# Patient Record
Sex: Female | Born: 1950 | Hispanic: No | Marital: Married | Smoking: Never smoker
Health system: Southern US, Community
[De-identification: ages and names within clinical notes are randomized; demographics above are authoritative.]

## PROBLEM LIST (undated history)

## (undated) ENCOUNTER — Emergency Department (HOSPITAL_COMMUNITY): Discharge status: Absent without leave | Payer: Self-pay

## (undated) DIAGNOSIS — E785 Hyperlipidemia, unspecified: Secondary | ICD-10-CM

## (undated) HISTORY — DX: Hyperlipidemia, unspecified: E78.5

---

## 2006-08-23 ENCOUNTER — Other Ambulatory Visit: Admission: RE | Admit: 2006-08-23 | Discharge: 2006-08-23 | Payer: Self-pay | Admitting: Family Medicine

## 2013-05-22 ENCOUNTER — Telehealth: Payer: Self-pay | Admitting: Family Medicine

## 2013-05-23 NOTE — Telephone Encounter (Signed)
Spoke with spouse and he stated needed appt for  cpx for her. appt given for august 12 -

## 2013-06-25 ENCOUNTER — Ambulatory Visit (INDEPENDENT_AMBULATORY_CARE_PROVIDER_SITE_OTHER): Payer: 59 | Admitting: Family Medicine

## 2013-06-25 ENCOUNTER — Encounter: Payer: Self-pay | Admitting: Family Medicine

## 2013-06-25 VITALS — BP 134/91 | HR 90 | Temp 97.1°F | Ht 63.0 in | Wt 140.0 lb

## 2013-06-25 DIAGNOSIS — M79643 Pain in unspecified hand: Secondary | ICD-10-CM | POA: Insufficient documentation

## 2013-06-25 DIAGNOSIS — E785 Hyperlipidemia, unspecified: Secondary | ICD-10-CM

## 2013-06-25 DIAGNOSIS — Z119 Encounter for screening for infectious and parasitic diseases, unspecified: Secondary | ICD-10-CM | POA: Insufficient documentation

## 2013-06-25 DIAGNOSIS — M79642 Pain in left hand: Secondary | ICD-10-CM

## 2013-06-25 DIAGNOSIS — Z124 Encounter for screening for malignant neoplasm of cervix: Secondary | ICD-10-CM

## 2013-06-25 DIAGNOSIS — Z0000 Encounter for general adult medical examination without abnormal findings: Secondary | ICD-10-CM | POA: Insufficient documentation

## 2013-06-25 DIAGNOSIS — J310 Chronic rhinitis: Secondary | ICD-10-CM | POA: Insufficient documentation

## 2013-06-25 MED ORDER — FLUTICASONE PROPIONATE 50 MCG/ACT NA SUSP: 2.0000 | Freq: Every day | NASAL | Status: DC

## 2013-06-25 NOTE — Progress Notes (Signed)
Patient ID: Judy Payne, female   DOB: May 15, 1951, 62 y.o.   MRN: [id number] SUBJECTIVE: CC: Chief Complaint  Patient presents with  . Annual Exam    cpx   HPI: Left nostril congestion and burning. Otherwise here for her annual check up  Past Medical History  Diagnosis Date  . Hyperlipidemia    No past surgical history on file. History   Social History  . Marital Status: Unknown    Spouse Name: N/A    Number of Children: N/A  . Years of Education: N/A   Occupational History  . Not on file.   Social History Main Topics  . Smoking status: Never Smoker   . Smokeless tobacco: Not on file  . Alcohol Use: 1.2 oz/week    2 Glasses of wine per week     Comment: occassionally  . Drug Use: No  . Sexually Active: Not on file   Other Topics Concern  . Not on file   Social History Narrative  . No narrative on file   Family History  Problem Relation Age of Onset  . Alzheimer's disease Mother   . Heart disease Father   . Diabetes Son   . Cancer Maternal Grandmother     ovarian  . Stroke Maternal Grandfather    No current outpatient prescriptions on file prior to visit.   No current facility-administered medications on file prior to visit.   No Known Allergies  There is no immunization history on file for this patient. Prior to Admission medications   Medication Sig Start Date End Date Taking? Authorizing Provider  fluticasone (FLONASE) 50 MCG/ACT nasal spray Place 2 sprays into the nose daily. 06/25/13   Ileana Ladd, MD     ROS: As above in the HPI. All other systems are stable or negative.  OBJECTIVE: APPEARANCE:  Patient in no acute distress.The patient appeared well nourished and normally developed. Acyanotic. Waist: VITAL SIGNS:BP 134/91  Pulse 90  Temp(Src) 97.1 F (36.2 C) (Oral)  Ht 5\' 3"  (1.6 m)  Wt 140 lb (63.504 kg)  BMI 24.81 kg/m2  WF looks very well.  SKIN: warm and  Dry without overt rashes, tattoos and scars  HEAD and Neck:  without JVD, Head and scalp: normal Eyes:No scleral icterus. Fundi normal, eye movements normal. Ears: Auricle normal, canal normal, Tympanic membranes normal, insufflation normal. Nose: slight left deviation of septum and nasal mucosal swelling on the left. Throat: normal Neck & thyroid: normal  CHEST & LUNGS: Chest wall: normal Lungs: Clear  Breast Exam: Appearance:  Skin : normal Areolas normal Nipples normal Dimples: Absent Palpation:Normal No Masses No Lumps  Lymph Nodes: Axillary: Normal  CVS: Reveals the PMI to be normally located. Regular rhythm, First and Second Heart sounds are normal,  absence of murmurs, rubs or gallops. Peripheral vasculature: Radial pulses: normal Dorsal pedis pulses: normal Posterior pulses: normal  ABDOMEN:  Appearance: normal Benign, no organomegaly, no masses, no Abdominal Aortic enlargement. No Guarding , no rebound. No Bruits. Bowel sounds: normal   RECTAL: benign , heme negative GU: atrophic vagina. PELVIC EXAM:  EXTERNAL Vulva:: Normal  SPECULUM: Vagina: atrophic  Cervix: Normal  BIMANUAL: CMT: Negative Adnexae: Normal  Uterus: Anteverted Normal Size  Rectovaginal: Normal Hemoccult: Negative    EXTREMETIES: nonedematous. Left palm: swelling tender base of the left forefinger, with slight prominence of the flexor tendon. Both Femoral and Pedal pulses are normal.  MUSCULOSKELETAL:  Spine: normal Joints: intact  NEUROLOGIC: oriented to time,place and person; nonfocal. Strength  is normal Sensory is normal Reflexes are normal Cranial Nerves are normal.  ASSESSMENT: Annual physical exam - Plan: EKG 12-Lead, Ambulatory referral to Hand Surgery, CBC With differential/Platelet  HLD (hyperlipidemia) - Plan: CMP14+EGFR, NMR, lipoprofile  Rhinitis - Plan: fluticasone (FLONASE) 50 MCG/ACT nasal spray  Hand pain, left - swelling;tendinopathy.? dupuytren's  Screening examination for infectious disease - Plan:  Hepatitis C antibody   PLAN: Orders Placed This Encounter  Procedures  . CBC With differential/Platelet    Standing Status: Future     Number of Occurrences:      Standing Expiration Date: 06/25/2014  . CMP14+EGFR    Standing Status: Future     Number of Occurrences:      Standing Expiration Date: 06/25/2014  . NMR, lipoprofile    Standing Status: Future     Number of Occurrences:      Standing Expiration Date: 06/25/2014  . Hepatitis C antibody    Standing Status: Future     Number of Occurrences:      Standing Expiration Date: 06/25/2014  . Ambulatory referral to Hand Surgery    Referral Priority:  Routine    Referral Type:  Surgical    Referral Reason:  Specialty Services Required    Requested Specialty:  Hand Surgery    Number of Visits Requested:  1  . EKG 12-Lead         HEALTH MAINTENANCE Immunizations: Tetanus-Diphtheria Booster due:2016 Pertusis Booster due:2016 Flu Shot Due: every Fall Pneumonia Vaccine: usually at 63 years of age unless there are certain risk situations. Herpes Zoster/Shingles Vaccine due: usually at 63 years of age HPV ZOX:WRUE age 90 to 79 years in males and females.  Healthy Life Habits: Exercise Goal: 5-6 days/week; start gradually(ie 30 minutes/3days per week) Nutrition: Balanced healthy meals including Vegetables and Fruits. Consider  Reading the following books: 1) Eat to Live by Dr Ottis Stain; 2) Prevent and Reverse Heart Disease by Dr Suzzette Righter.  Vitamins:ok a multivitamin Aspirin:n/a Stop Tobacco Use:n/a Seat Belt Use:+++ recommended Sunscreen Use:+++ recommended Osteoporosis Prevention: 1) Exercise 2) Calcium/Vitamin D requirements:he [employer] [facility] [employer] recommends:    Calcium:  800 mg/day for children 77-69 years of age          63 mg/day for children 78-63 years of age          63 mg/day for adults 65-8 years of age          63 mg/day for everyone more than 63 years of  age     Vitamin D: 800 IU per day or as prescribed if you are deficient.  Recommended Screening Tests: Colon Cancer Screening:due 2016 Blood work: tomorrow Cholesterol Screening:    Tomorrow fasting        HIV:    ?               Hepatitis C(people born 1945-1965):tomorrow  Mammogram:ASAP DEXA/Bone Density: ASAP GYN Exam: today Monthly Self Breast Exam: +++  Eye Exam: every 1 to 2 years recommended Dental Health: at least every 6 months  Others:    Living Will/Healthcare Power of Attorney: should have this in order with your personal estate planning  Return in about 1 year (around 06/25/2014) for physical.  Thelma Barge P. [place] Charon, M.D.

## 2013-06-25 NOTE — Patient Instructions (Addendum)
HEALTH MAINTENANCE Immunizations: Tetanus-Diphtheria Booster due:2016 Pertusis Booster due:2016 Flu Shot Due: every Fall Pneumonia Vaccine: usually at 63 years of age unless there are certain risk situations. Herpes Zoster/Shingles Vaccine due: usually at 63 years of age HPV NWG:NFAO age 30 to 33 years in males and females.  Healthy Life Habits: Exercise Goal: 5-6 days/week; start gradually(ie 30 minutes/3days per week) Nutrition: Balanced healthy meals including Vegetables and Fruits. Consider  Reading the following books: 1) Eat to Live by Dr Ottis Stain; 2) Prevent and Reverse Heart Disease by Dr Suzzette Righter.  Vitamins:ok a multivitamin Aspirin:n/a Stop Tobacco Use:n/a Seat Belt Use:+++ recommended Sunscreen Use:+++ recommended Osteoporosis Prevention: 1) Exercise 2) Calcium/Vitamin D requirements:he [employer] [facility] [employer] recommends:    Calcium:  800 mg/day for children 56-72 years of age          63 mg/day for children 43-75 years of age          63 mg/day for adults 23-62 years of age          63 mg/day for everyone more than 63 years of age     Vitamin D: 800 IU per day or as prescribed if you are deficient.  Recommended Screening Tests: Colon Cancer Screening:due 2016 Blood work: tomorrow Cholesterol Screening:    Tomorrow fasting        HIV:    ?               Hepatitis C(people born 1945-1965):tomorrow  Mammogram:ASAP DEXA/Bone Density: ASAP GYN Exam: today Monthly Self Breast Exam: +++  Eye Exam: every 1 to 2 years recommended Dental Health: at least every 6 months  Others:    Living Will/Healthcare Power of Attorney: should have this in order with your personal estate planning

## 2013-06-25 NOTE — Addendum Note (Signed)
Addended by: Orma Render F on: 06/25/2013 04:35 PM   Modules accepted: Orders

## 2013-06-26 ENCOUNTER — Other Ambulatory Visit (INDEPENDENT_AMBULATORY_CARE_PROVIDER_SITE_OTHER): Payer: 59

## 2013-06-26 DIAGNOSIS — Z0000 Encounter for general adult medical examination without abnormal findings: Secondary | ICD-10-CM

## 2013-06-26 DIAGNOSIS — E785 Hyperlipidemia, unspecified: Secondary | ICD-10-CM

## 2013-06-26 DIAGNOSIS — Z119 Encounter for screening for infectious and parasitic diseases, unspecified: Secondary | ICD-10-CM

## 2013-06-26 NOTE — Progress Notes (Signed)
Patient came in for labs only.

## 2013-06-28 LAB — CMP14+EGFR
ALT: 8 IU/L (ref 0–32)
AST: 15 IU/L (ref 0–40)
Albumin/Globulin Ratio: 2.4 (ref 1.1–2.5)
Albumin: 4.4 g/dL (ref 3.6–4.8)
Alkaline Phosphatase: 76 IU/L (ref 39–117)
BUN/Creatinine Ratio: 20 (ref 11–26)
BUN: 17 mg/dL (ref 8–27)
CO2: 25 mmol/L (ref 18–29)
Calcium: 9.8 mg/dL (ref 8.6–10.2)
Chloride: 105 mmol/L (ref 97–108)
Creatinine, Ser: 0.86 mg/dL (ref 0.57–1.00)
GFR calc Af Amer: 83 mL/min/{1.73_m2} (ref >59)
GFR calc non Af Amer: 72 mL/min/{1.73_m2} (ref >59)
Globulin, Total: 1.8 g/dL (ref 1.5–4.5)
Glucose: 85 mg/dL (ref 65–99)
Potassium: 5 mmol/L (ref 3.5–5.2)
Sodium: 143 mmol/L (ref 134–144)
Total Bilirubin: 0.4 mg/dL (ref 0.0–1.2)
Total Protein: 6.2 g/dL (ref 6.0–8.5)

## 2013-06-28 LAB — NMR, LIPOPROFILE
Cholesterol: 238 mg/dL — ABNORMAL HIGH (ref <200)
HDL Cholesterol by NMR: 64 mg/dL (ref >=40)
HDL Particle Number: 35.2 umol/L (ref >=30.5)
LDL Particle Number: 2405 nmol/L — ABNORMAL HIGH (ref <1000)
LDL Size: 20.9 nm (ref >20.5)
LDLC SERPL CALC-MCNC: 156 mg/dL — ABNORMAL HIGH (ref <100)
LP-IR Score: 36 (ref <=45)
Small LDL Particle Number: 1059 nmol/L — ABNORMAL HIGH (ref <=527)
Triglycerides by NMR: 88 mg/dL (ref <150)

## 2013-06-28 LAB — PAP IG W/ RFLX HPV ASCU: PAP Smear Comment: 0

## 2013-06-28 LAB — CBC WITH DIFFERENTIAL
Basophils Absolute: 0.1 10*3/uL (ref 0.0–0.2)
Basos: 1 % (ref 0–3)
Eos: 3 % (ref 0–5)
Eosinophils Absolute: 0.2 10*3/uL (ref 0.0–0.4)
HCT: 43.8 % (ref 34.0–46.6)
Hemoglobin: 15.1 g/dL (ref 11.1–15.9)
Immature Grans (Abs): 0 10*3/uL (ref 0.0–0.1)
Immature Granulocytes: 0 % (ref 0–2)
Lymphocytes Absolute: 1.9 10*3/uL (ref 0.7–3.1)
Lymphs: 33 % (ref 14–46)
MCH: 32.4 pg (ref 26.6–33.0)
MCHC: 34.5 g/dL (ref 31.5–35.7)
MCV: 94 fL (ref 79–97)
Monocytes Absolute: 0.5 10*3/uL (ref 0.1–0.9)
Monocytes: 8 % (ref 4–12)
Neutrophils Absolute: 3.1 10*3/uL (ref 1.4–7.0)
Neutrophils Relative %: 55 % (ref 40–74)
Platelets: 254 10*3/uL (ref 150–379)
RBC: 4.66 x10E6/uL (ref 3.77–5.28)
RDW: 12.5 % (ref 12.3–15.4)
WBC: 5.8 10*3/uL (ref 3.4–10.8)

## 2013-06-28 LAB — HEPATITIS C ANTIBODY: Hep C Virus Ab: <0.1 s/co ratio (ref 0.0–0.9)

## 2013-06-28 NOTE — Progress Notes (Signed)
Quick Note:  Call patient. Labs normal. No change in plan. ______ 

## 2013-06-30 NOTE — Progress Notes (Signed)
Quick Note:  Labs abnormal. The bad LDLc is elevated , but the good HDLc is also high and the good cholesterol off sets the bad cholesterol. For now a low fat, low cholesterol diet and exercise is what is recommended. The rest looks good. ______

## 2013-09-04 ENCOUNTER — Ambulatory Visit (INDEPENDENT_AMBULATORY_CARE_PROVIDER_SITE_OTHER): Payer: 59

## 2013-09-04 DIAGNOSIS — Z23 Encounter for immunization: Secondary | ICD-10-CM

## 2014-09-08 ENCOUNTER — Other Ambulatory Visit: Payer: Self-pay | Admitting: *Deleted

## 2014-09-08 DIAGNOSIS — R002 Palpitations: Secondary | ICD-10-CM

## 2014-09-10 ENCOUNTER — Encounter (INDEPENDENT_AMBULATORY_CARE_PROVIDER_SITE_OTHER): Payer: 59

## 2014-09-10 ENCOUNTER — Encounter: Payer: Self-pay | Admitting: *Deleted

## 2014-09-10 DIAGNOSIS — R002 Palpitations: Secondary | ICD-10-CM

## 2014-09-10 NOTE — Progress Notes (Signed)
Patient ID: Judy MassBrenda Liszewski, female   DOB: 1951-04-21, 63 y.o.   MRN: [id number] [employer] 30 day cardiac event monitor applied to patient.  [employer] contacted to mail patient additional 7129m Red Dot electrodes due to Latex allergy.

## 2015-12-03 ENCOUNTER — Ambulatory Visit (INDEPENDENT_AMBULATORY_CARE_PROVIDER_SITE_OTHER): Payer: Medicare Other | Admitting: Pediatrics

## 2015-12-03 ENCOUNTER — Encounter: Payer: Self-pay | Admitting: Pediatrics

## 2015-12-03 VITALS — BP 127/87 | HR 89 | Temp 97.7°F | Ht 63.0 in | Wt 122.2 lb

## 2015-12-03 DIAGNOSIS — E785 Hyperlipidemia, unspecified: Secondary | ICD-10-CM

## 2015-12-03 DIAGNOSIS — E875 Hyperkalemia: Secondary | ICD-10-CM

## 2015-12-03 DIAGNOSIS — J019 Acute sinusitis, unspecified: Secondary | ICD-10-CM | POA: Diagnosis not present

## 2015-12-03 DIAGNOSIS — Z1239 Encounter for other screening for malignant neoplasm of breast: Secondary | ICD-10-CM | POA: Diagnosis not present

## 2015-12-03 DIAGNOSIS — Z23 Encounter for immunization: Secondary | ICD-10-CM

## 2015-12-03 MED ORDER — AZITHROMYCIN 250 MG PO TABS: ORAL_TABLET | ORAL | Status: DC

## 2015-12-03 NOTE — Progress Notes (Signed)
    Subjective:    Patient ID: Judy Payne, female    DOB: 09-14-1951, 65 y.o.   MRN: [id number]  CC: sinusitis  HPI: Judy Payne is a 65 y.o. female presenting for sinus problems, follow up.   Had a pneumonia shot  Had Dexa scan within past couple of years  Colonoscopy Oct 2016 In [place]  Exercise: clearing out the woods regularly Eating lots of fruits and vegetables Keeps her 9yo grandson  Has had over a month of pain in her sinuses going back and forth from L side to R side. Still with some nasal discharge. No fevers.    Depression screen PHQ 2/9 12/03/2015  Decreased Interest 0  Down, Depressed, Hopeless 0  PHQ - 2 Score 0     Relevant past medical, surgical, family and social history reviewed and updated as indicated. Interim medical history since our last visit reviewed. Allergies and medications reviewed and updated.    ROS: All systems negative other than what is in HPI  History  Smoking status  . Never Smoker   Smokeless tobacco  . Not on file    Past Medical History Patient Active Problem List   Diagnosis Date Noted  . Annual physical exam 06/25/2013  . Screening examination for infectious disease 06/25/2013  . HLD (hyperlipidemia) 06/25/2013  . Rhinitis 06/25/2013  . Hand pain 06/25/2013    Current Outpatient Prescriptions  Medication Sig Dispense Refill  . azithromycin (ZITHROMAX) 250 MG tablet Take 2 the first day and then one each day after. 6 tablet 0   No current facility-administered medications for this visit.       Objective:    BP 127/87 mmHg  Pulse 89  Temp(Src) 97.7 F (36.5 C) (Oral)  Ht '5\' 3"'$  (1.6 m)  Wt 122 lb 3.2 oz (55.43 kg)  BMI 21.65 kg/m2  Wt Readings from Last 3 Encounters:  12/03/15 122 lb 3.2 oz (55.43 kg)  06/25/13 140 lb (63.504 kg)     Gen: NAD, alert, cooperative with exam, NCAT EYES: EOMI, no scleral injection or icterus ENT:  TMs pearly gray b/l, OP with erythema, mild tenderness over L  max sinus LYMPH: no cervical LAD CV: NRRR, normal S1/S2, no murmur, distal pulses 2+ b/l Resp: CTABL, no wheezes, normal WOB Abd: +BS, soft, NTND. no guarding or organomegaly Ext: No edema, warm Neuro: Alert and oriented, strength equal b/l UE and LE, coordination grossly normal MSK: normal muscle bulk Skin: no visible lesions     Assessment & Plan:    Aleesha was seen today for sinusitis and multiple med problem follow up.   Diagnoses and all orders for this visit:  Acute sinusitis, recurrence not specified, unspecified location -     azithromycin (ZITHROMAX) 250 MG tablet; Take 2 the first day and then one each day after.  Hyperkalemia -     BMP8+EGFR  Hyperlipidemia -     Lipid panel  Screening for breast cancer -     MM DIGITAL SCREENING BILATERAL; Future  Encounter for immunization  Need for prophylactic vaccination with Streptococcus pneumoniae (Pneumococcus) and Influenza vaccines  Other orders -     Flu Vaccine QUAD 36+ mos IM -     Pneumococcal conjugate vaccine 13-valent    Follow up plan: Return in about 1 year (around 12/02/2016).  Assunta Found, MD [facility] 12/03/2015, 12:33 PM

## 2015-12-04 LAB — BMP8+EGFR
BUN / CREAT RATIO: 29 — AB (ref 11–26)
BUN: 23 mg/dL (ref 8–27)
CO2: 25 mmol/L (ref 18–29)
CREATININE: 0.78 mg/dL (ref 0.57–1.00)
Calcium: 9.7 mg/dL (ref 8.7–10.3)
Chloride: 103 mmol/L (ref 96–106)
GFR, EST AFRICAN AMERICAN: 92 mL/min/{1.73_m2} (ref >59)
GFR, EST NON AFRICAN AMERICAN: 80 mL/min/{1.73_m2} (ref >59)
Glucose: 94 mg/dL (ref 65–99)
Potassium: 4.9 mmol/L (ref 3.5–5.2)
SODIUM: 142 mmol/L (ref 134–144)

## 2015-12-04 LAB — LIPID PANEL
CHOL/HDL RATIO: 3.1 ratio (ref 0.0–4.4)
Cholesterol, Total: 232 mg/dL — ABNORMAL HIGH (ref 100–199)
HDL: 76 mg/dL (ref >39)
LDL CALC: 137 mg/dL — AB (ref 0–99)
TRIGLYCERIDES: 97 mg/dL (ref 0–149)
VLDL Cholesterol Cal: 19 mg/dL (ref 5–40)

## 2015-12-11 ENCOUNTER — Ambulatory Visit: Payer: Self-pay | Admitting: Pediatrics

## 2016-10-03 ENCOUNTER — Encounter: Payer: Medicare Other | Admitting: *Deleted

## 2016-12-05 ENCOUNTER — Ambulatory Visit (INDEPENDENT_AMBULATORY_CARE_PROVIDER_SITE_OTHER): Payer: Medicare Other

## 2016-12-05 ENCOUNTER — Ambulatory Visit (INDEPENDENT_AMBULATORY_CARE_PROVIDER_SITE_OTHER): Payer: Medicare Other | Admitting: Pediatrics

## 2016-12-05 ENCOUNTER — Encounter: Payer: Self-pay | Admitting: Pediatrics

## 2016-12-05 VITALS — BP 126/82 | HR 79 | Temp 97.5°F | Ht 63.0 in | Wt 125.0 lb

## 2016-12-05 DIAGNOSIS — E785 Hyperlipidemia, unspecified: Secondary | ICD-10-CM

## 2016-12-05 DIAGNOSIS — M25561 Pain in right knee: Secondary | ICD-10-CM | POA: Diagnosis not present

## 2016-12-05 DIAGNOSIS — Z0000 Encounter for general adult medical examination without abnormal findings: Secondary | ICD-10-CM

## 2016-12-05 DIAGNOSIS — Z23 Encounter for immunization: Secondary | ICD-10-CM | POA: Diagnosis not present

## 2016-12-05 NOTE — Addendum Note (Signed)
Addended by: Caryl BisBOWMAN, Mackenze Grandison M on: 12/05/2016 11:21 AM   Modules accepted: Orders

## 2016-12-05 NOTE — Patient Instructions (Signed)
[facility] Appointment: [phone]  

## 2016-12-05 NOTE — Progress Notes (Signed)
Subjective:   Patient ID: Judy Payne, female    DOB: Oct 15, 1951, 66 y.o.   MRN: [id number] CC: Annual Exam and Knee Pain (Right)  HPI: Judy Payne is a 66 y.o. female presenting for Annual Exam and Knee Pain (Right)  Was on the floor, felt something twist getting up Takes 1-2 ibuprofen once a day Has had knee pain for the past 12 years since twisting her knee walking down steps and falling hard on it Thinks since then her patellae has stayed to the lateral side of R knee Usually tolerant of pain Will sometimes do something to her knee, it hurts a lot for a few days then it gets better Lifting bent knee up over 90 degrees at hip is when she really notices it now With knee brace on can stand well now Has a slight limp with walking that is normal  Missed mammogram appt last fall with husband Illness Husband had a stroke 4 months ago, cant walk as well as below Affected R side, he cant drive anymore Doing better now  Dad had MI at 74 Mom with alzheim  Has had neck pain for the past few years On both sides, radiates up to top of head at times  Trying to eat fruits, veg regularly Stays active now that she is doing most of housework with husband illness  Has neck pain at times Starts in sides of neck, sometimes goes to the top of her head Not in the middle of spine Shoveling or using arms a lot make pain worse Thinks it started after trip to [place] in the 80s, bent her neck back hard in a wave  No CP, no SOB, normal stooling Last colonoscopy in 2016 No fam h/o breast ca or colon ca  Relevant past medical, surgical, family and social history reviewed. Allergies and medications reviewed and updated. History  Smoking Status  . Never Smoker  Smokeless Tobacco  . Never Used   ROS: All systems negative other than what is in HPI  Objective:    BP 126/82   Pulse 79   Temp 97.5 F (36.4 C) (Oral)   Ht _0  (1.6 m)   Wt 125 lb (56.7 kg)   BMI 22.14 kg/m   Wt  Readings from Last 3 Encounters:  12/05/16 125 lb (56.7 kg)  12/03/15 122 lb 3.2 oz (55.4 kg)  06/25/13 140 lb (63.5 kg)    Gen: NAD, alert, cooperative with exam, NCAT EYES: EOMI, no conjunctival injection, or no icterus ENT:  TMs pearly gray b/l, OP without erythema LYMPH: one small mobile, nontender apprx 1 cm cervical LN L side CV: NRRR, normal S1/S2, no murmur, distal pulses 2+ b/l Resp: CTABL, no wheezes, normal WOB Abd: +BS, soft, NTND. no guarding or organomegaly Ext: No edema, warm Neuro: Alert and oriented MSK: bony enlargement R knee No joint line tenderness R patella slightly deviated laterally, normal patellae side to side ROM, slight TTP over knee over distal quad No effusion No redness No point tenderness over spine  Assessment & Plan:  Judy Payne was seen today for annual exam and knee pain.  Diagnoses and all orders for this visit:  Encounter for preventive health examination -     BMP8+EGFR -     MM DIGITAL SCREENING BILATERAL; Future  Hyperlipidemia, unspecified hyperlipidemia type ASCVD 10 yr risk 5.4%, cont veg/fruit heavy diet  Acute pain of right knee Acute on chronic pain Cont NSAIDs, rest, knee brace if helpful -  DG Knee 1-2 Views Right; Future -     Ambulatory referral to Orthopedic Surgery   Follow up plan: Return in about 1 year (around 12/05/2017). Assunta Found, MD [place]

## 2016-12-06 LAB — BMP8+EGFR
BUN/Creatinine Ratio: 21 (ref 12–28)
BUN: 17 mg/dL (ref 8–27)
CHLORIDE: 102 mmol/L (ref 96–106)
CO2: 22 mmol/L (ref 18–29)
CREATININE: 0.8 mg/dL (ref 0.57–1.00)
Calcium: 9.5 mg/dL (ref 8.7–10.3)
GFR calc Af Amer: 89 mL/min/{1.73_m2} (ref >59)
GFR calc non Af Amer: 77 mL/min/{1.73_m2} (ref >59)
GLUCOSE: 88 mg/dL (ref 65–99)
Potassium: 4.1 mmol/L (ref 3.5–5.2)
SODIUM: 143 mmol/L (ref 134–144)

## 2017-04-08 ENCOUNTER — Encounter: Payer: Self-pay | Admitting: Pediatrics

## 2017-04-12 ENCOUNTER — Encounter: Payer: Medicare Other | Admitting: *Deleted

## 2017-04-17 ENCOUNTER — Telehealth: Payer: Self-pay

## 2017-04-17 DIAGNOSIS — G8929 Other chronic pain: Secondary | ICD-10-CM

## 2017-04-17 DIAGNOSIS — M25561 Pain in right knee: Principal | ICD-10-CM

## 2017-04-17 NOTE — Telephone Encounter (Signed)
Pt has had rt knee pain that has become more constant. She would like a referral to ortho

## 2017-04-18 NOTE — Telephone Encounter (Signed)
Faxed to [facility]. 

## 2017-04-18 NOTE — Telephone Encounter (Signed)
Order in, should hear within the week

## 2017-04-21 NOTE — Telephone Encounter (Signed)
Letter sent with appointment date/time 

## 2017-09-05 ENCOUNTER — Ambulatory Visit (INDEPENDENT_AMBULATORY_CARE_PROVIDER_SITE_OTHER): Payer: Medicare Other

## 2017-09-05 DIAGNOSIS — Z23 Encounter for immunization: Secondary | ICD-10-CM

## 2017-09-11 ENCOUNTER — Telehealth: Payer: Self-pay

## 2017-09-11 DIAGNOSIS — M25569 Pain in unspecified knee: Principal | ICD-10-CM

## 2017-09-11 DIAGNOSIS — G8929 Other chronic pain: Secondary | ICD-10-CM

## 2017-09-11 NOTE — Telephone Encounter (Signed)
Saw [facility] her for knee injection  Need another referral  Knee hurting alot

## 2017-09-11 NOTE — Addendum Note (Signed)
Addended by: Johna SheriffVINCENT, CAROL L on: 09/11/2017 05:06 PM   Modules accepted: Orders

## 2017-09-12 NOTE — Telephone Encounter (Signed)
Patient aware that referral has been placed to [facility].

## 2017-12-06 ENCOUNTER — Encounter: Payer: Medicare Other | Admitting: Pediatrics

## 2017-12-29 IMAGING — DX DG KNEE 1-2V*R*
2 series · 2 of 2 positions shown · non-contrast
Comparison: None.

CLINICAL DATA: Right knee pain and swelling.

EXAM:
RIGHT KNEE - 1-2 VIEW

[knee ap]
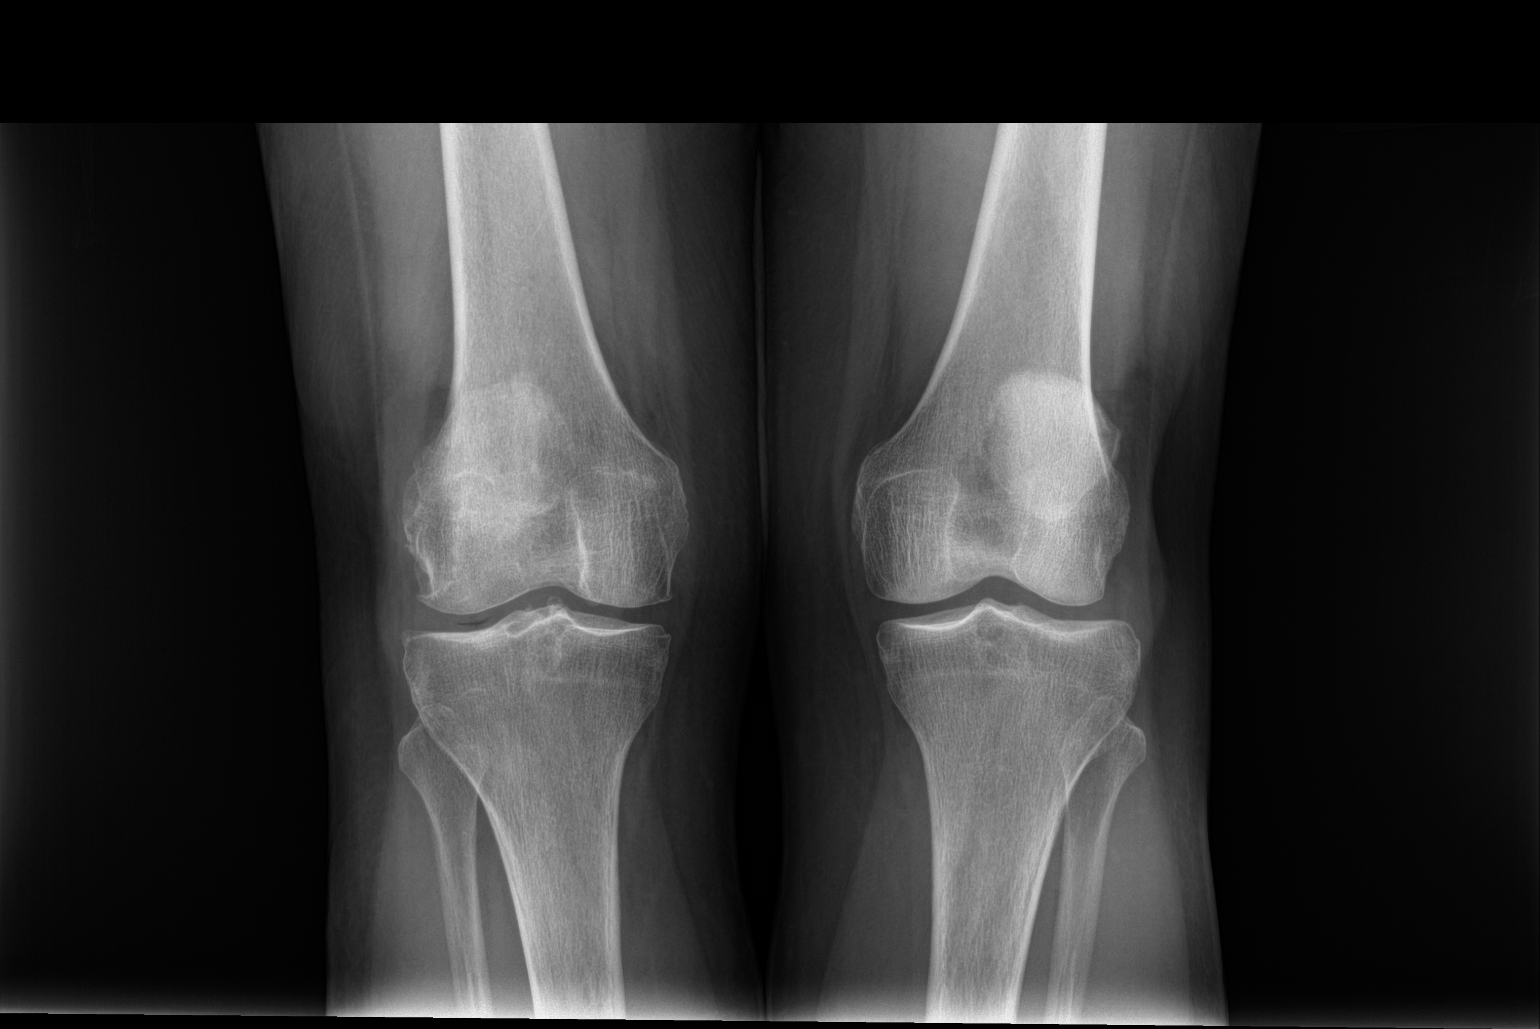

[knee lat]
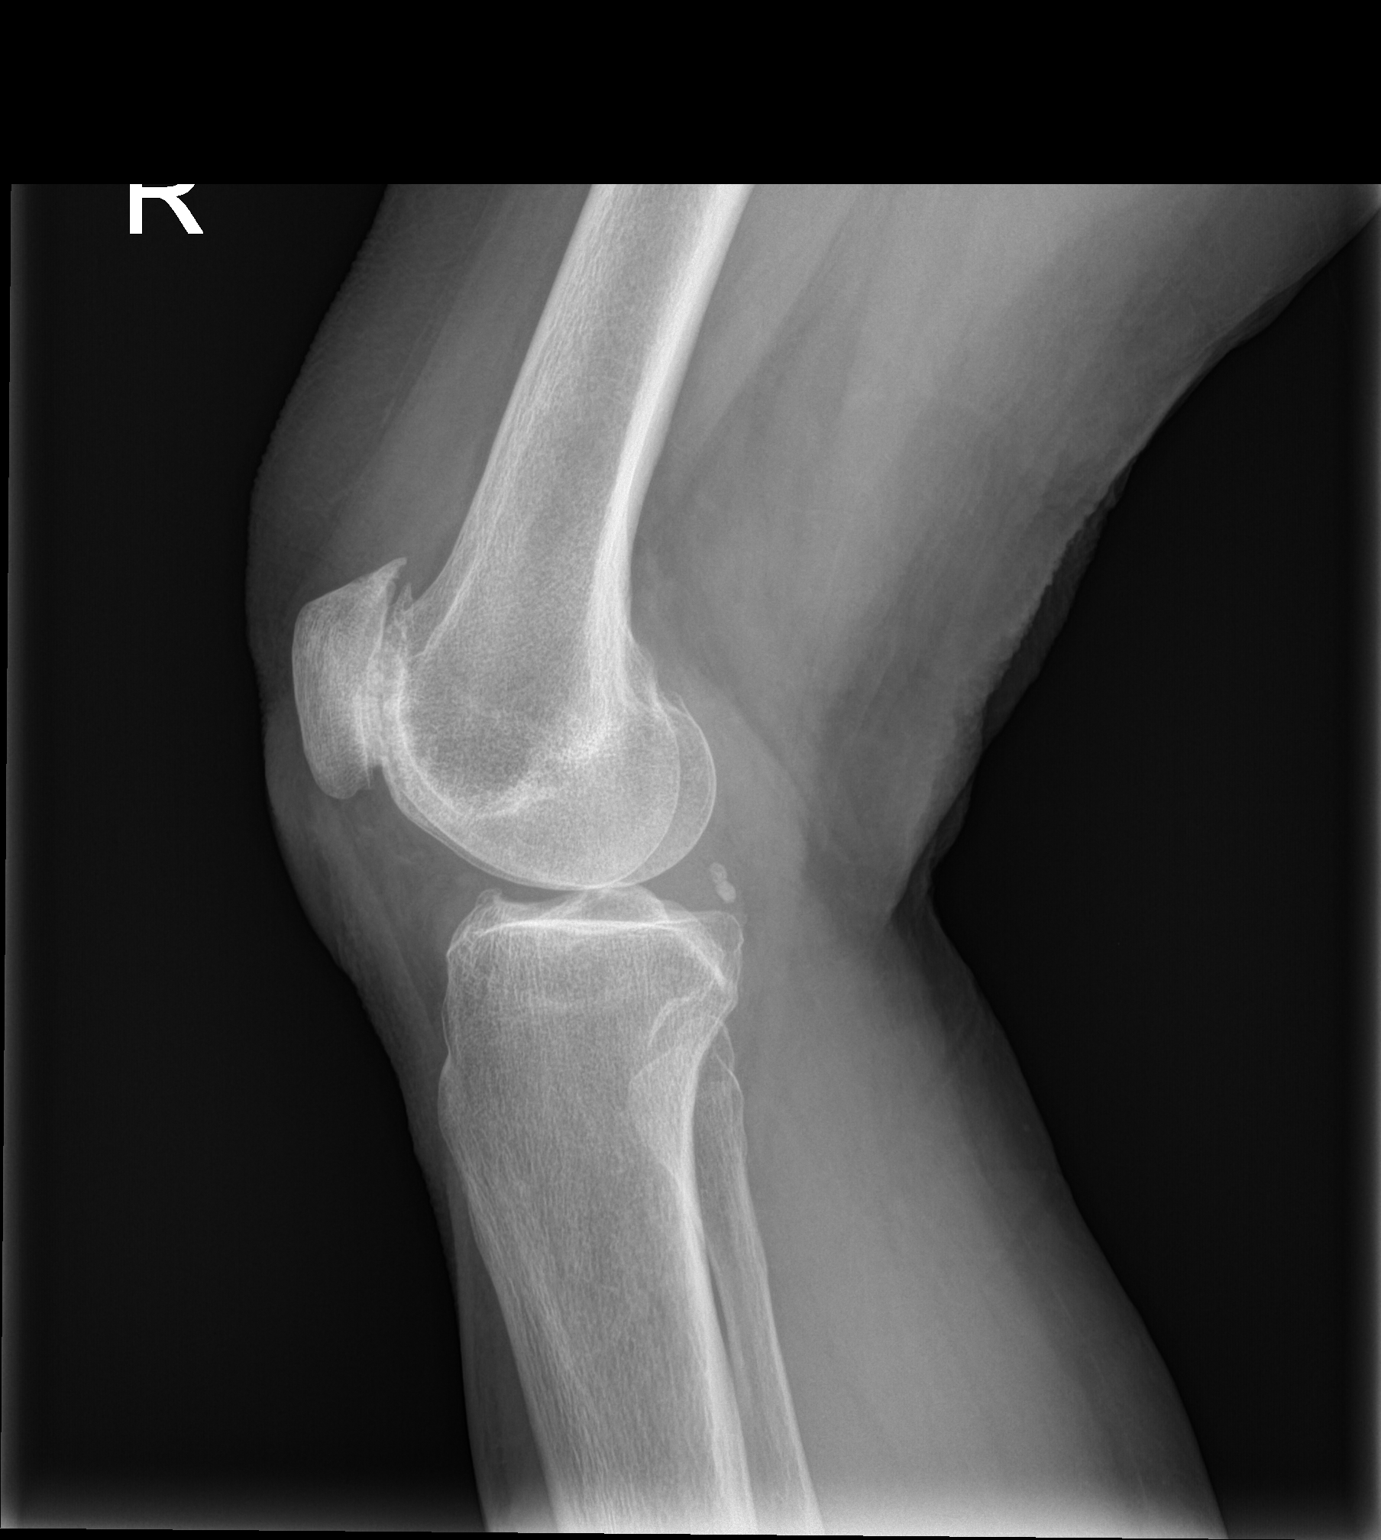

[2 of 2 positions shown; findings below may reference images not displayed]

FINDINGS: No acute fracture or dislocation. Severe osteoarthritis of the
patellofemoral compartment. Small right knee joint effusion. Medial
and lateral femorotibial compartment joint spaces are maintained.
IMPRESSION: Severe osteoarthritis of the patellofemoral compartment.
